# Patient Record
Sex: Female | Born: 1965 | Race: Black or African American | Hispanic: No | Marital: Married | Smoking: Never smoker
Health system: Southern US, Community
[De-identification: ages and names within clinical notes are randomized; demographics above are authoritative.]

## PROBLEM LIST (undated history)

## (undated) DIAGNOSIS — I340 Nonrheumatic mitral (valve) insufficiency: Secondary | ICD-10-CM

## (undated) DIAGNOSIS — M461 Sacroiliitis, not elsewhere classified: Secondary | ICD-10-CM

## (undated) DIAGNOSIS — D509 Iron deficiency anemia, unspecified: Secondary | ICD-10-CM

## (undated) DIAGNOSIS — R002 Palpitations: Secondary | ICD-10-CM

## (undated) DIAGNOSIS — K5090 Crohn's disease, unspecified, without complications: Secondary | ICD-10-CM

## (undated) DIAGNOSIS — I341 Nonrheumatic mitral (valve) prolapse: Secondary | ICD-10-CM

## (undated) DIAGNOSIS — E162 Hypoglycemia, unspecified: Secondary | ICD-10-CM

## (undated) DIAGNOSIS — Z77090 Contact with and (suspected) exposure to asbestos: Secondary | ICD-10-CM

## (undated) DIAGNOSIS — R202 Paresthesia of skin: Secondary | ICD-10-CM

## (undated) DIAGNOSIS — A6000 Herpesviral infection of urogenital system, unspecified: Secondary | ICD-10-CM

## (undated) DIAGNOSIS — I071 Rheumatic tricuspid insufficiency: Secondary | ICD-10-CM

## (undated) DIAGNOSIS — J45909 Unspecified asthma, uncomplicated: Secondary | ICD-10-CM

## (undated) DIAGNOSIS — E559 Vitamin D deficiency, unspecified: Secondary | ICD-10-CM

## (undated) DIAGNOSIS — M47819 Spondylosis without myelopathy or radiculopathy, site unspecified: Secondary | ICD-10-CM

## (undated) HISTORY — DX: Paresthesia of skin: R20.2

## (undated) HISTORY — DX: Palpitations: R00.2

## (undated) HISTORY — DX: Rheumatic tricuspid insufficiency: I07.1

## (undated) HISTORY — DX: Unspecified asthma, uncomplicated: J45.909

## (undated) HISTORY — PX: CHOLECYSTECTOMY: SHX55

## (undated) HISTORY — DX: Nonrheumatic mitral (valve) insufficiency: I34.0

## (undated) HISTORY — PX: ABDOMINAL SURGERY: SHX537

## (undated) HISTORY — DX: Nonrheumatic mitral (valve) prolapse: I34.1

## (undated) HISTORY — DX: Hypoglycemia, unspecified: E16.2

## (undated) HISTORY — DX: Iron deficiency anemia, unspecified: D50.9

## (undated) HISTORY — DX: Herpesviral infection of urogenital system, unspecified: A60.00

## (undated) HISTORY — DX: Spondylosis without myelopathy or radiculopathy, site unspecified: M47.819

## (undated) HISTORY — DX: Contact with and (suspected) exposure to asbestos: Z77.090

## (undated) HISTORY — DX: Vitamin D deficiency, unspecified: E55.9

## (undated) HISTORY — DX: Sacroiliitis, not elsewhere classified: M46.1

---

## 1997-10-08 ENCOUNTER — Inpatient Hospital Stay (HOSPITAL_COMMUNITY): Admission: AD | Admit: 1997-10-08 | Discharge: 1997-10-08 | Payer: Self-pay | Admitting: Obstetrics and Gynecology

## 1998-05-04 ENCOUNTER — Inpatient Hospital Stay (HOSPITAL_COMMUNITY): Admission: AD | Admit: 1998-05-04 | Discharge: 1998-05-04 | Payer: Self-pay | Admitting: Obstetrics & Gynecology

## 1998-05-08 ENCOUNTER — Observation Stay (HOSPITAL_COMMUNITY): Admission: AD | Admit: 1998-05-08 | Discharge: 1998-05-09 | Payer: Self-pay | Admitting: *Deleted

## 1998-05-15 ENCOUNTER — Inpatient Hospital Stay (HOSPITAL_COMMUNITY): Admission: AD | Admit: 1998-05-15 | Discharge: 1998-05-21 | Payer: Self-pay | Admitting: Obstetrics and Gynecology

## 2000-11-28 ENCOUNTER — Encounter: Admission: RE | Admit: 2000-11-28 | Discharge: 2000-11-28 | Payer: Self-pay | Admitting: Gastroenterology

## 2000-11-28 ENCOUNTER — Encounter: Payer: Self-pay | Admitting: Gastroenterology

## 2000-12-18 ENCOUNTER — Encounter (INDEPENDENT_AMBULATORY_CARE_PROVIDER_SITE_OTHER): Payer: Self-pay | Admitting: Specialist

## 2000-12-18 ENCOUNTER — Ambulatory Visit (HOSPITAL_COMMUNITY): Admission: RE | Admit: 2000-12-18 | Discharge: 2000-12-18 | Payer: Self-pay | Admitting: Gastroenterology

## 2001-01-19 ENCOUNTER — Other Ambulatory Visit: Admission: RE | Admit: 2001-01-19 | Discharge: 2001-01-19 | Payer: Self-pay | Admitting: Obstetrics and Gynecology

## 2001-03-06 ENCOUNTER — Encounter (INDEPENDENT_AMBULATORY_CARE_PROVIDER_SITE_OTHER): Payer: Self-pay | Admitting: Specialist

## 2001-03-06 ENCOUNTER — Inpatient Hospital Stay (HOSPITAL_COMMUNITY): Admission: RE | Admit: 2001-03-06 | Discharge: 2001-03-12 | Payer: Self-pay | Admitting: Surgery

## 2001-07-29 ENCOUNTER — Encounter: Admission: RE | Admit: 2001-07-29 | Discharge: 2001-07-29 | Payer: Self-pay | Admitting: Gastroenterology

## 2001-07-29 ENCOUNTER — Encounter: Payer: Self-pay | Admitting: Gastroenterology

## 2001-12-28 ENCOUNTER — Inpatient Hospital Stay (HOSPITAL_COMMUNITY): Admission: AD | Admit: 2001-12-28 | Discharge: 2001-12-28 | Payer: Self-pay | Admitting: Obstetrics and Gynecology

## 2002-01-01 ENCOUNTER — Encounter: Payer: Self-pay | Admitting: Obstetrics and Gynecology

## 2002-01-01 ENCOUNTER — Ambulatory Visit (HOSPITAL_COMMUNITY): Admission: RE | Admit: 2002-01-01 | Discharge: 2002-01-01 | Payer: Self-pay | Admitting: Obstetrics and Gynecology

## 2002-02-01 ENCOUNTER — Other Ambulatory Visit: Admission: RE | Admit: 2002-02-01 | Discharge: 2002-02-01 | Payer: Self-pay | Admitting: Obstetrics and Gynecology

## 2002-02-18 ENCOUNTER — Encounter: Payer: Self-pay | Admitting: Obstetrics and Gynecology

## 2002-02-18 ENCOUNTER — Ambulatory Visit (HOSPITAL_COMMUNITY): Admission: RE | Admit: 2002-02-18 | Discharge: 2002-02-18 | Payer: Self-pay | Admitting: Obstetrics and Gynecology

## 2002-02-25 ENCOUNTER — Encounter (INDEPENDENT_AMBULATORY_CARE_PROVIDER_SITE_OTHER): Payer: Self-pay

## 2002-02-25 ENCOUNTER — Ambulatory Visit (HOSPITAL_COMMUNITY): Admission: RE | Admit: 2002-02-25 | Discharge: 2002-02-25 | Payer: Self-pay | Admitting: Obstetrics and Gynecology

## 2003-05-09 ENCOUNTER — Other Ambulatory Visit: Admission: RE | Admit: 2003-05-09 | Discharge: 2003-05-09 | Payer: Self-pay | Admitting: Obstetrics and Gynecology

## 2003-06-02 ENCOUNTER — Encounter: Admission: RE | Admit: 2003-06-02 | Discharge: 2003-06-02 | Payer: Self-pay | Admitting: Obstetrics and Gynecology

## 2004-08-06 ENCOUNTER — Other Ambulatory Visit: Admission: RE | Admit: 2004-08-06 | Discharge: 2004-08-06 | Payer: Self-pay | Admitting: Obstetrics and Gynecology

## 2004-08-14 ENCOUNTER — Ambulatory Visit (HOSPITAL_COMMUNITY): Admission: RE | Admit: 2004-08-14 | Discharge: 2004-08-14 | Payer: Self-pay | Admitting: Gastroenterology

## 2005-09-12 ENCOUNTER — Encounter: Admission: RE | Admit: 2005-09-12 | Discharge: 2005-09-12 | Payer: Self-pay | Admitting: Surgery

## 2005-10-09 ENCOUNTER — Other Ambulatory Visit: Admission: RE | Admit: 2005-10-09 | Discharge: 2005-10-09 | Payer: Self-pay | Admitting: Obstetrics and Gynecology

## 2005-10-17 ENCOUNTER — Encounter: Admission: RE | Admit: 2005-10-17 | Discharge: 2005-10-17 | Payer: Self-pay | Admitting: Gastroenterology

## 2005-12-13 ENCOUNTER — Inpatient Hospital Stay (HOSPITAL_COMMUNITY): Admission: RE | Admit: 2005-12-13 | Discharge: 2005-12-19 | Payer: Self-pay | Admitting: Surgery

## 2005-12-13 ENCOUNTER — Encounter (INDEPENDENT_AMBULATORY_CARE_PROVIDER_SITE_OTHER): Payer: Self-pay | Admitting: *Deleted

## 2007-05-13 ENCOUNTER — Encounter: Admission: RE | Admit: 2007-05-13 | Discharge: 2007-05-13 | Payer: Self-pay | Admitting: Obstetrics and Gynecology

## 2007-10-23 DIAGNOSIS — I341 Nonrheumatic mitral (valve) prolapse: Secondary | ICD-10-CM

## 2007-10-23 DIAGNOSIS — I340 Nonrheumatic mitral (valve) insufficiency: Secondary | ICD-10-CM

## 2007-10-23 DIAGNOSIS — I071 Rheumatic tricuspid insufficiency: Secondary | ICD-10-CM

## 2007-10-23 HISTORY — DX: Nonrheumatic mitral (valve) insufficiency: I34.0

## 2007-10-23 HISTORY — DX: Nonrheumatic mitral (valve) prolapse: I34.1

## 2007-10-23 HISTORY — DX: Rheumatic tricuspid insufficiency: I07.1

## 2008-05-13 ENCOUNTER — Encounter: Admission: RE | Admit: 2008-05-13 | Discharge: 2008-05-13 | Payer: Self-pay | Admitting: Obstetrics and Gynecology

## 2009-05-16 ENCOUNTER — Encounter: Admission: RE | Admit: 2009-05-16 | Discharge: 2009-05-16 | Payer: Self-pay | Admitting: Obstetrics and Gynecology

## 2009-05-26 ENCOUNTER — Encounter: Admission: RE | Admit: 2009-05-26 | Discharge: 2009-05-26 | Payer: Self-pay | Admitting: Obstetrics and Gynecology

## 2009-11-19 IMAGING — MG MM SCREEN MAMMOGRAM BILATERAL
4 series · 4 of 4 positions shown · non-contrast
Comparison: none

DG SCREEN MAMMOGRAM BILATERAL
Bilateral CC and MLO view(s) were taken.

DIGITAL SCREENING MAMMOGRAM WITH CAD:
The breast tissue is heterogeneously dense.  No masses or malignant type calcifications are 
identified.  Compared with prior studies.

[R CC]
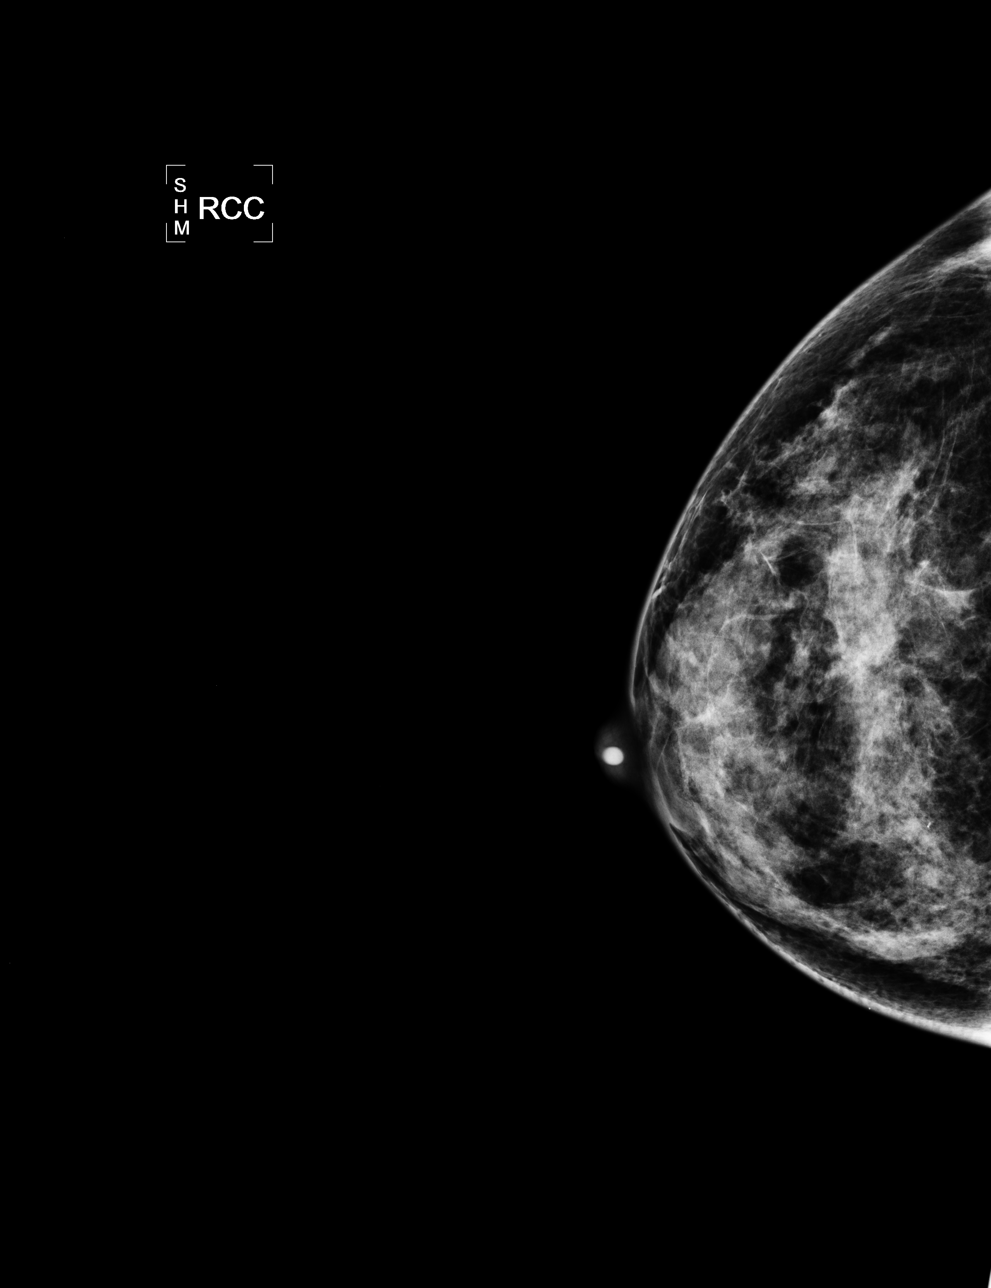

[L CC]
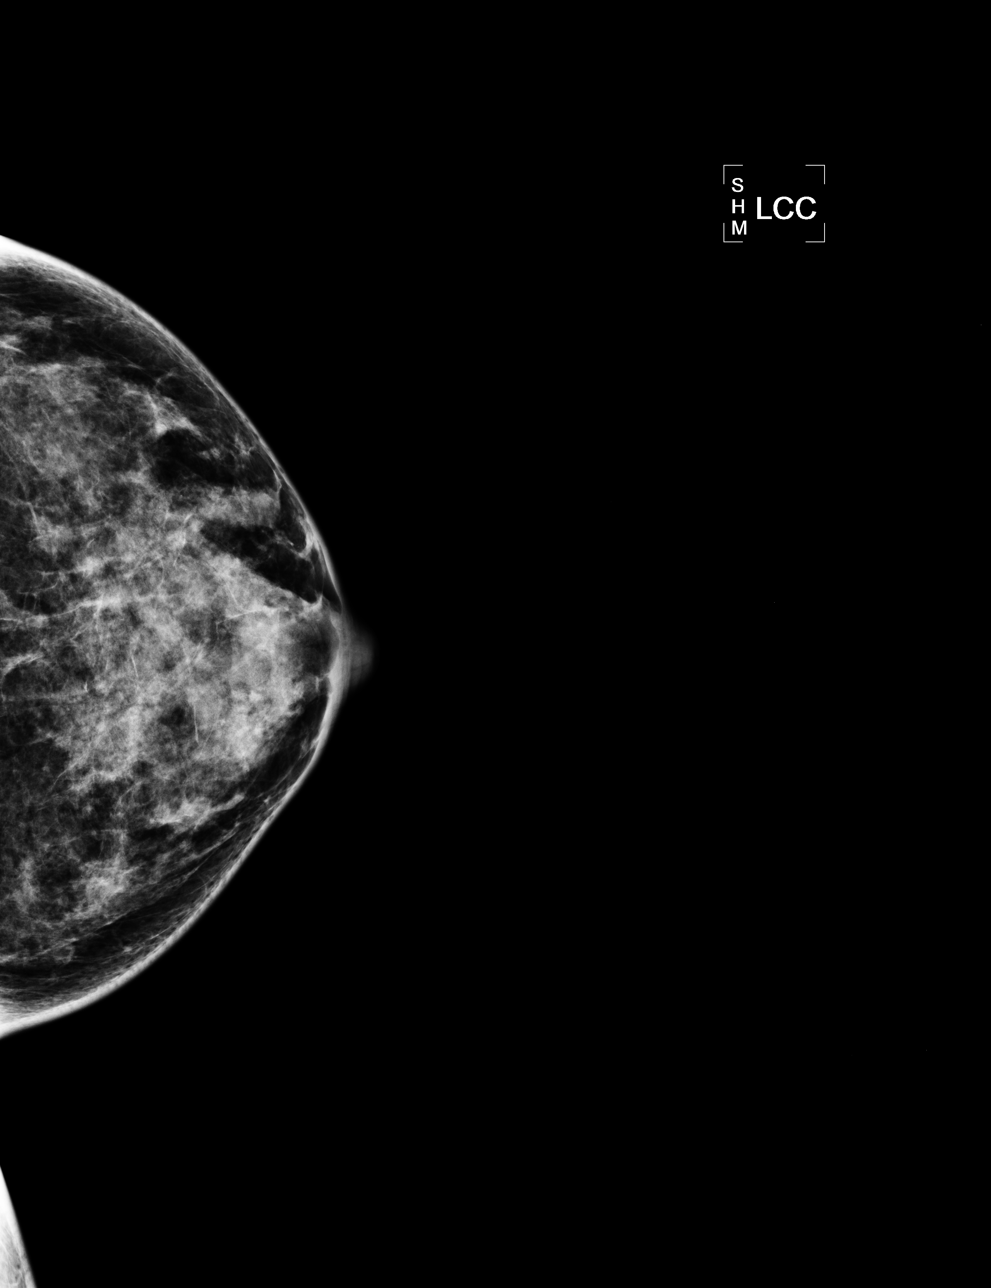

[L MLO]
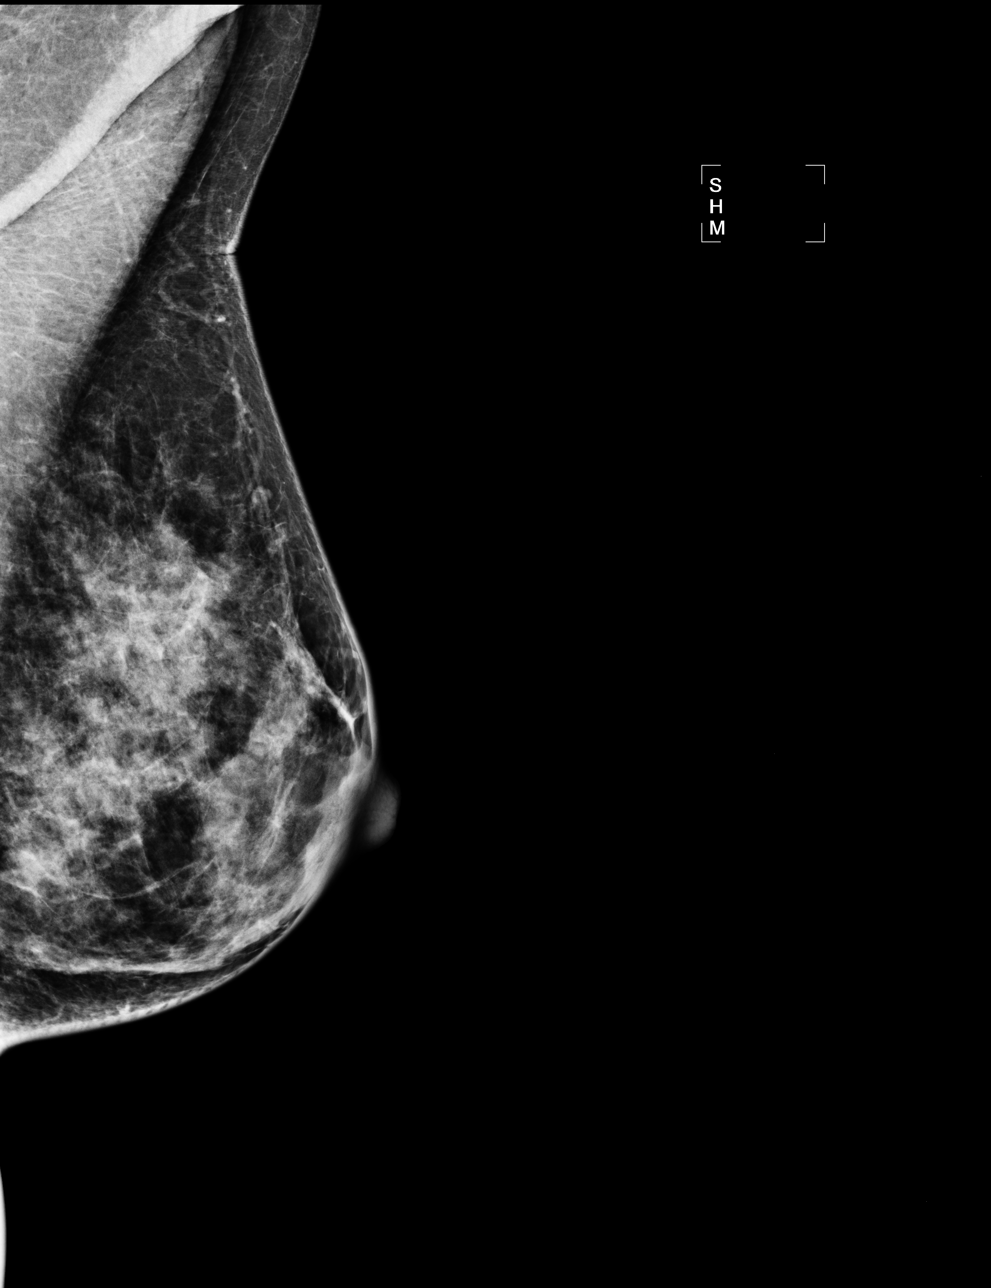

[R MLO]
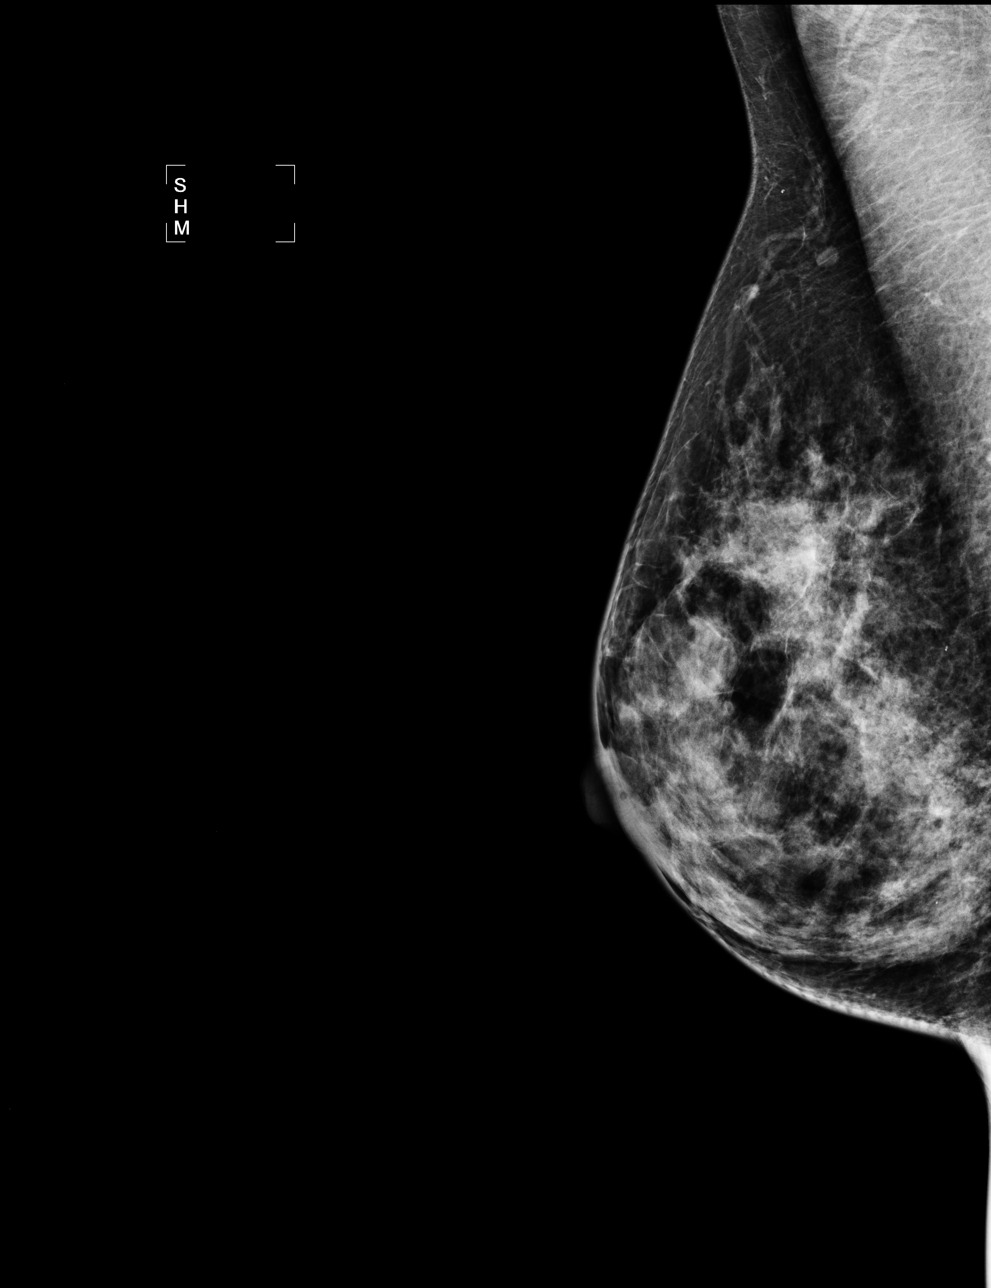

[4 of 4 positions shown; findings below may reference images not displayed]

IMPRESSION: No specific mammographic evidence of malignancy.  Next screening mammogram is recommended in one 
year.

ASSESSMENT: Negative - BI-RADS 1

Screening mammogram in 1 year.
ANALYZED BY COMPUTER AIDED DETECTION. , THIS PROCEDURE WAS A DIGITAL MAMMOGRAM.

## 2010-03-23 ENCOUNTER — Encounter: Admission: RE | Admit: 2010-03-23 | Discharge: 2010-03-23 | Payer: Self-pay | Admitting: Family Medicine

## 2010-05-07 ENCOUNTER — Ambulatory Visit (HOSPITAL_COMMUNITY): Admission: RE | Admit: 2010-05-07 | Discharge: 2010-05-08 | Payer: Self-pay | Admitting: General Surgery

## 2010-05-07 ENCOUNTER — Encounter (INDEPENDENT_AMBULATORY_CARE_PROVIDER_SITE_OTHER): Payer: Self-pay | Admitting: General Surgery

## 2010-05-30 ENCOUNTER — Encounter
Admission: RE | Admit: 2010-05-30 | Discharge: 2010-05-30 | Payer: Self-pay | Source: Home / Self Care | Admitting: Obstetrics and Gynecology

## 2010-07-15 ENCOUNTER — Encounter: Payer: Self-pay | Admitting: Gastroenterology

## 2010-09-04 LAB — CBC
HCT: 34.4 % — ABNORMAL LOW (ref 36.0–46.0)
HCT: 40.5 % (ref 36.0–46.0)
Hemoglobin: 13.4 g/dL (ref 12.0–15.0)
MCHC: 33 g/dL (ref 30.0–36.0)
MCHC: 33.9 g/dL (ref 30.0–36.0)
MCV: 82.6 fL (ref 78.0–100.0)
Platelets: 323 10*3/uL (ref 150–400)
RBC: 4.9 MIL/uL (ref 3.87–5.11)
RDW: 13.8 % (ref 11.5–15.5)
WBC: 13.1 10*3/uL — ABNORMAL HIGH (ref 4.0–10.5)

## 2010-09-04 LAB — DIFFERENTIAL
Eosinophils Absolute: 0.5 10*3/uL (ref 0.0–0.7)
Lymphocytes Relative: 29 % (ref 12–46)

## 2010-09-04 LAB — COMPREHENSIVE METABOLIC PANEL
Alkaline Phosphatase: 71 U/L (ref 39–117)
Alkaline Phosphatase: 88 U/L (ref 39–117)
BUN: 14 mg/dL (ref 6–23)
BUN: 5 mg/dL — ABNORMAL LOW (ref 6–23)
CO2: 29 mEq/L (ref 19–32)
Calcium: 9 mg/dL (ref 8.4–10.5)
Chloride: 101 mEq/L (ref 96–112)
Chloride: 106 mEq/L (ref 96–112)
Creatinine, Ser: 0.7 mg/dL (ref 0.4–1.2)
GFR calc Af Amer: >60 mL/min (ref >60)
GFR calc non Af Amer: >60 mL/min (ref >60)
Glucose, Bld: 120 mg/dL — ABNORMAL HIGH (ref 70–99)
Glucose, Bld: 85 mg/dL (ref 70–99)
Potassium: 4.2 mEq/L (ref 3.5–5.1)
Total Protein: 7 g/dL (ref 6.0–8.3)

## 2010-11-09 NOTE — Discharge Summary (Signed)
[facility]  Patient:    Melanie Hansen, Melanie Hansen Visit Number: [id number] MRN: [id number]          Service Type: SUR Location: 4W 0470 01 Attending Physician:  Shelly Rubenstein Admit Date:  03/06/2001 Discharge Date: 03/12/2001   CC:         Verlin Grills, M.D.   Discharge Summary  SUMMARY OF HISTORY:  Melanie Hansen is a pleasant 45 year old female with a history of peptic ulcer disease who now presents with stricturing of her duodenum confirmed by upper GI series as well as endoscopy by Dr. Danise Edge.  She is now admitted for elective vagotomy and antrectomy.  HOSPITAL COURSE:  The patient was admission to the hospital on March 06, 2001, and taken to the operating room where she underwent a vagotomy and antrectomy.  She tolerated the procedure well and was taken in stable condition to a regular surgical floor with an NG tube in place.  Over the next several days, she was maintained on NPO status and NG drainage which she tolerated very well.  Her hemoglobin was 7.9 postoperatively, but she has had chronic anemia with hemoglobins as low as 5.  By postoperative day #4, she continued to do well and her NG tube was removed.  By postoperative day #6, she was continuing to do well and her hemoglobin was 6.8 and stable at this time.  She remained asymptomatic from this and she was advanced to a regular diet.  By September 19, she was doing very well.  She was tolerating a regular diet, was ambulating well and her incision was healing well.  Her hemoglobin, at this time, was 7.1.  The decision was made to discharge the patient to home.  DISCHARGE DIAGNOSIS:  Gastric outlet obstruction status post vagotomy and antrectomy.  DISCHARGE DIET:  Regular.  DISCHARGE ACTIVITY:  As tolerated.  DISCHARGE INSTRUCTIONS:  She will take Vicodin for pain.  She will continue iron.  She will resume her other home medications.  She may shower.  She  will follow up in my office in one week post discharge. Attending Physician:  Shelly Rubenstein DD:  04/06/01 TD:  04/06/01 Job: [id number]

## 2010-11-09 NOTE — H&P (Signed)
NAME:  Melanie Hansen, Melanie Hansen                         ACCOUNT NO.:  [id number]   MEDICAL RECORD NO.:  [id number]                   PATIENT TYPE:  AMB   LOCATION:  SDC                                  FACILITY:  [facility]   PHYSICIAN:  Naima A. Dillard, M.D.              DATE OF BIRTH:  September 15, 1965   DATE OF ADMISSION:  02/25/2002  DATE OF DISCHARGE:                                HISTORY & PHYSICAL   CHIEF COMPLAINT:  Missed abortion in the first trimester.   HISTORY OF PRESENT ILLNESS:  The patient is a 45 year old African-American  female G2, P1-0-0-1 whose last menstrual period was November 24, 2001.  The  patient was seen for spotting in maternity admissions January 01, 2002 and  ultrasound demonstrated a positive yolk sac with a crown rump length of 6  weeks 1 day, no fetal heart tones.  Repeat ultrasound on February 10, 2002 was  done for viability.  There were no fetal heart tones and no fetal movement  or fetal pole with an irregular gestational sac measuring 7 weeks 6 days.  Another repeat ultrasound was done February 18, 2002 which demonstrated a  gestational sac measuring 8 weeks 3 days, but no fetal pole.  Normal right  ovary and normal left ovary with no adnexal masses or free fluid.  The  patient denies having any vaginal bleeding, cramping, or abdominal pain.   PAST MEDICAL HISTORY:  Unremarkable.   PAST OBSTETRIC HISTORY:  Significant for a cesarean section in November 1999  secondary to failure to progress.  There were no complications.   PAST SURGICAL HISTORY:  Significant for a cesarean section x1 and stomach  surgery for questionable blockage.  She says she had a vagotomy.   MEDICATIONS:  None.   FAMILY HISTORY:  Significant for diabetes and hypertension in both parents.  No GYN cancer.   SOCIAL HISTORY:  Negative x3.   REVIEW OF SYMPTOMS:  ENDOCRINE:  Unremarkable.  CARDIOVASCULAR:  She has a  history of some heart palpitations.  She was taking a medication that she  cannot  recall and all this occurred after she had a vagotomy so she is  currently followed by a cardiologist.  GASTROINTESTINAL:  As above.  NEUROLOGIC:  Unremarkable.  MUSCULOSKELETAL:  Unremarkable.  GENITOURINARY:  As above.   PHYSICAL EXAMINATION:  VITAL SIGNS:  Blood pressure 90/60, weight 99 pounds.  GENERAL:  She is a well-developed, well-nourished female in no apparent  distress.  HEENT:  Head is normocephalic, atraumatic.  NECK:  Free range of motion.  HEART:  Regular rate and rhythm.  LUNGS:  Clear to auscultation bilaterally.  ABDOMEN:  Nondistended, soft, and nontender.  PELVIC:  Vulvovaginal examination is within normal limits.  Her cervix has  no lesions, closed.  Her uterus is about 8 weeks size, nontender with no  adnexal masses.  EXTREMITIES:  No clubbing, cyanosis, edema.   LABORATORIES:  She  is O-.   ASSESSMENT:  Missed abortion in the first trimester.  The patient was given  the options of observation versus a dilatation and evacuation versus trying  to miscarry on her own.  The patient has chosen dilatation and evacuation.  She understands that the risks are, but not limited to, bleeding, infection,  damage to internal organs by perforation, and Asherman's syndrome.  The  patient still agreed with the plan.  She was already given RhoGAM and found  to have positive anti D antibodies on her blood that was drawn in August  2003.                                               Naima A. Normand Sloop, M.D.    NAD/MEDQ  D:  02/25/2002  T:  02/25/2002  Job:  [id number]

## 2010-11-09 NOTE — Op Note (Signed)
   NAME:  Melanie Hansen, Melanie Hansen                         ACCOUNT NO.:  [id number]   MEDICAL RECORD NO.:  [id number]                   PATIENT TYPE:  AMB   LOCATION:  SDC                                  FACILITY:  [facility]   PHYSICIAN:  Naima A. Dillard, M.D.              DATE OF BIRTH:  [birth year]   DATE OF PROCEDURE:  DATE OF DISCHARGE:                                 OPERATIVE REPORT   PREOPERATIVE DIAGNOSES:  Missed abortion in the first trimester.   POSTOPERATIVE DIAGNOSES:  Missed abortion in the first trimester.   PROCEDURE:  Dilatation and evacuation.   SURGEON:  Naima A. Normand Sloop, M.D.   ANESTHESIA:  MAC.   ESTIMATED BLOOD LOSS:  Minimal.   IV FLUIDS:  700 cc crystalloid.   Scant products of conception were obtained.   FINDINGS:  An 8 week sized uterus, nontender with no adnexal masses.  There  was plenty of fluid obtained, only it looks like scant POC.  Beta hCG quant  before procedure was 57.   PROCEDURE IN DETAIL:  The patient was taken to the operating room where she  was given MAC anesthesia, placed in dorsal lithotomy position, and prepped  and draped in the normal sterile fashion.  A bivalve speculum was placed  into the vagina.  The anterior lip of the cervix was grasped with a single  tooth tenaculum.  Cervix was then dilated with Pratt dilators up to 21.  Suction curettage was then used to obtain products of conception and curette  the uterus.  There was a lot of fluid obtained; however, just scant products  of conception obtained.  Sharp curette was then placed into the uterus and  sharp curettage was done until a gritty texture was noted.  Still, scant  POCs were obtained.  The suction curettage was then replaced into the uterus  and only blood foam just came from the uterus.  The patient did deny passing  any tissue; however, a quant is only 57.  All instruments were removed from  the vagina.  Tenaculum was removed from the cervix with good hemostasis  noted.   Dictation ended at this point.                                               Naima A. Normand Sloop, M.D.    NAD/MEDQ  D:  02/25/2002  T:  02/25/2002  Job:  [id number]

## 2010-11-09 NOTE — Op Note (Signed)
[facility]  Patient:    SHANNIE, KONTOS Visit Number: [id number] MRN: [id number]          Service Type: END Location: ENDO Attending Physician:  Dennison Bulla Ii Proc. Date: 03/06/01 Admit Date:  12/18/2000 Discharge Date: 12/18/2000                             Operative Report  PREOPERATIVE DIAGNOSIS:   Gastric outlet obstruction.  POSTOPERATIVE DIAGNOSIS:  Gastric outlet obstruction.  OPERATION:  Vagotomy and antrectomy.  SURGEON:  Abigail Miyamoto, M.D.  ASSISTANT: Angelia Mould. Derrell Lolling, M.D.  INDICATIONS:  Malissa Slay is a 45 year old female with peptic ulcer disease who presents with stricturing of her duodenum.  Upper GI series demonstrated the stricturing in the second portion of the duodenum.  This was confirmed on endoscopy by Dr. Reece Agar.  He was unable to pass his endoscope past the area in the duodenum.  Despite conservative measures, the patient has not been able to resolve the gastric outlet obstruction.  She has only been able to take liquids.  Secondary to this, decision was made to proceed to the operating room for vagotomy and antrectomy.  FINDINGS:  Endoscopic biopsies of the area confirmed the ulceration without evidence of carcinoma.  DESCRIPTION OF PROCEDURE: The patient was brought to the operating room and identified as Ranelle Oyster.  She is placed on the operating table and general anesthesia was induced.  Her abdomen was then prepped and draped in the usual sterile fashion.  An upper midline incision was then created with the scalpel.  This was carried down through the fascia with electrocautery. The peritoneum was opened entirely.  Upon entering the abdomen, the stomach and duodenum were examined.  The patient was found to have a large area of stricturing in the second portion of the duodenum from the pylorus past the area of the common bile duct.  Given these findings, decision was made to proceed  with an antrectomy and vagotomy.  The esophagus was then examined. The anterior vagus was identified and clipped proximally and distally and then transected, and the specimen was sent to pathology for identification. The window just medial to the esophagus was opened with the electrocautery and Metzenbaum scissors.  A Penrose drain was then passed around the esophagus, and the esophagus was retracted.  The posterior vagus was then identified and clipped proximally and distally and a section was transected and sent to pathology for identification as well.  Next, the lesser mandible was opened up with Kelly clamps with silk ties.  The omentum was also opened up creating a window at the incisor of the stomach.  The proximal stomach was then transected with TA90 stapler.  The dissection was then distally carried distally, freeing up the stomach from its blood supply to the pylorus and duodenum.  The duodenum was then transected again with a TA90 stapler just distal to the pylorus but proximal to the strictured disease.  The stomach was then oversewn with 2-0 silk sutures.  Next a window was created in the colonic mesentery.  The ligament of Treitz was identified and small bowel was then identified distal to this and brought up in a retrocolic fashion.  The small bowel was then reapproximated to the posterior wall of the stomach by first putting the bowel and approximately with silk sutures.  An anastomosis was then created after enterotomies were made with the TIA-75 stapler.  The opening  was then closed with two separate firings of the TIA-60 stapler.     A large anastomosis appeared to be created.  The suture line appeared to be hemostatic.  At this point, the abdomen was copiously irrigated with normal saline.  The anastomosis was brought down through the window in the colonic mesentery and sewn in place circumferentially with interrupted 3-0 silk sutures to the mesentery.  The NG was  examined and found to be in good place. From this point, all sponges were retracted and removed from the abdomen.  The midline fascia was then closed with a running #1 PDS suture.  The skin was then irrigated and closed with skin staples.  The patient tolerated the procedure well.  All sponge, needle, and instrument counts were correct at the end of the procedure.  The patient was then taken to the operating room and taken in stable condition to the recovery room. Attending Physician:  Dennison Bulla Ii DD:  03/06/01 TD:  03/06/01 Job: [id number]

## 2010-11-09 NOTE — Op Note (Signed)
NAMEMICHELL, GIULIANO NO.:  [id number]   MEDICAL RECORD NO.:  [id number]          PATIENT TYPE:  AMB   LOCATION:  ENDO                         FACILITY:  [facility]   PHYSICIAN:  Danise Edge, M.D.   DATE OF BIRTH:  03-10-1966   DATE OF PROCEDURE:  08/14/2004  DATE OF DISCHARGE:                                 OPERATIVE REPORT   PROCEDURE:  Esophagogastroduodenoscopy and colonoscopy.   INDICATIONS:  Melanie Hansen is a 45 year old female, born March 30, 1966.  Melanie Hansen is undergoing diagnostic colonoscopy to evaluate  intermittent painless hematochezia.  She has undergone a Billroth II  gastrectomy to manage peptic ulcer disease complicated by duodenal  obstruction.   ENDOSCOPIST:  Reece Agar, MD   PREMEDICATION:  1.  Versed 7 mg.  2.  Demerol 50 mg.   PROCEDURE:  Diagnostic esophagogastroduodenoscopy.  After obtaining informed  consent, Melanie Hansen was placed in the left lateral decubitus position.  I  administered intravenous Demerol and intravenous Versed to achieve conscious  sedation for the procedure.  The patient's blood pressure, oxygen  saturation, and cardiac rhythm were monitored throughout the procedure and  documented in the medical record.   The Olympus gastroscope was passed through the posterior hypopharynx and  into the proximal esophagus without difficulty.  The hypopharynx and larynx  appeared normal.  I did not visualize the vocal cords.   Esophagoscopy:  The proximal, mid, and lower segments of the esophageal  mucosa appeared normal.   Gastroscopy:  Retroflexed view of the gastric cardia and fundus was normal.  The remaining gastric pouch appears normal.  The Billroth II anastomosis is  widely patent.  The efferent and afferent limbs of the gastrojejunostomy  appeared normal.   Assessment:  Normal esophagogastroenteroscopy post Billroth II gastrectomy.   PROCEDURE:  Colonoscopy to the proximal ascending colon.  Anal  inspection  and digital rectal exam were normal.  The Olympus adjustable pediatric  colonoscope was introduced into the rectum and advanced to the proximal  ascending colon.  I was able to peer into the cecum but not intubate the  cecum due to colonic loop formation.  Colonic preparation for the exam today  is quite poor with a large amount of residual liquid and soft stool  throughout the colon.   Rectum normal.  Sigmoid colon and descending colon normal.  Splenic flexure normal.  Transverse colon normal.  Hepatic flexure normal.  Ascending colon normal  Cecum and ileocecal valve.  The cecum was not intubated due to colonic loop  formation. What I could visualize of the cecum appeared normal.   ASSESSMENT:  Normal proctocolonoscopy to the proximal ascending colon.  Despite having a poor colonic prep with water lavage of the rectum and  colon, I got a reasonable look and do not find any signs of inflammatory  bowel disease and no large polyps or active bleeding from the large  intestine.      MJ/MEDQ  D:  08/14/2004  T:  08/14/2004  Job:  [id number]

## 2010-11-09 NOTE — Procedures (Signed)
[facility]  Patient:    Melanie Hansen, Melanie Hansen                        MRN: [id number] Proc. Date: 12/18/00 Attending:  Verlin Grills, M.D. CC:         Arvella Merles, M.D.   Procedure Report  PROCEDURE:  Esophagogastroduodenoscopy.  INDICATIONS:  Ms. Melanie Hansen (date of birth 07-13-2065) is a 45 year old female.  In August 2001, she developed epigastric discomfort, heartburn, and regurgitation not associated with dysphasia or odynophagia.  She also had menorrhagia complicated by iron deficiency anemia.  [name] was placed on a proton pump inhibitor (Nexium) which has eliminated her heartburn and regurgitation.  She is now experiencing predominantly postprandial epigastric discomfort associated with excessive belching and flatus.  Her bowel movements remain normal.  [name] maximum weight has been 125 pounds, but she reports here usual weight as being approximately 115 pounds.  She currently weighs 100 pounds. As a result of her intestinal symptoms and family stress, including the death of her grandmother, she has lost weight.  Her menorrhagia seems to have lessened since starting her low estrogen birth control pills.  She remains on Nexium 40 mg each morning to prevent heartburn and regurgitation.  MEDICATION ALLERGIES:  None.  CHRONIC MEDICATIONS:  Nexium, Zyrtec, Lotensin, multivitamin with iron.  PAST MEDICAL HISTORY:  No chronic medical problems or surgery.  HABITS:  [name] does not smoke cigarettes or consume alcohol.  FAMILY HISTORY:  Her 64 year old father has hypertension.  Her 23 year old mother has diabetes and peptic ulcer disease.  Her 16 year old sister has multiple sclerosis.  SOCIAL HISTORY:  [name] is married.  Her 27 year old husband is in good health.  Her 30-year-old son is in good health.  [name] underwent an abdominal ultrasound and upper GI x-ray series at the [facility] a couple of weeks ago.  Her  abdominal ultrasound was normal.  Her upper GI x-ray series revealed pyloric channel narrowing.  ENDOSCOPIST:  Verlin Grills, M.D.  PREMEDICATIONS:  Demerol 50 mg, Versed 5 mg.  ENDOSCOPE:  Olympus gastroscope.  DESCRIPTION OF PROCEDURE:  After obtaining informed consent, Ms. Melanie Hansen was placed in the left lateral decubitus position.  I administered intravenous Demerol and intravenous Versed to achieve conscious sedation for the procedure.  The patients blood pressure, oxygen saturation, and cardiac rhythm were monitored throughout the procedure and documented in the medical record.  The Olympus gastroscope was passed through the posterior hypopharynx into the proximal esophagus without difficulty.  The hypopharynx, larynx and vocal cords appeared normal.  Esophagoscopy:  The proximal, mid, and lower segments of the esophagus appeared completely normal.  The squamocolumnar junction and the esophagogastric junction are noted at approximately 40 cm from the incisor teeth.  Endoscopically, there is no evidence of for the presence of Barretts esophagus, esophageal mucosal scarring, erosive esophagitis, or esophageal ulceration.  Gastroscopy:  Retroflex view of the gastric cardia and fundus was normal.  The gastric body and antrum appeared normal.  There was narrowing of the pyloric channel.  Duodenoscopy:  There is marked scarring and ulceration of the pyloric channel, proximal duodenal bulb.  There is marked narrowing in the duodenum to the point I was unable to intubate the second portion of the duodenum.  CLOtest was performed from pyloric channel biopsy.  Biopsies were also taken from the scarred, ulcerated duodenal bulb.  ASSESSMENT:  Probable chronic peptic ulcer disease and acute duodenal ulceration  complicated by duodenal obstruction.  RECOMMENDATIONS:  Await biopsies.  The patient is already on Nexium.  I think she will require surgery to relieve her duodenal  obstruction. DD:  12/18/00 TD:  12/18/00 Job: [id number]

## 2010-11-09 NOTE — Discharge Summary (Signed)
Melanie Hansen, Melanie Hansen NO.:  [id number]   MEDICAL RECORD NO.:  [id number]          PATIENT TYPE:  INP   LOCATION:  5737                         FACILITY:  [facility]   PHYSICIAN:  Wilmon Arms. Corliss Skains, M.D. 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   PROCEDURE:  Exploratory laparotomy with sigmoid colon resection and small  bowel resection.   HISTORY:  The patient is a pleasant 45 year old female who was recently  diagnosed with Crohn disease.  Workup included a CT scan which showed  thickening of the terminal ileum with an enterocolic fistula of the distal  ileum to the sigmoid colon.  Her symptoms have improved with medical  treatment and she presented for resection of the infected segments of  bowels.   HOSPITAL COURSE:  The patient was admitted to the hospital on December 13, 2005,  after undergoing a bowel prep at home.  She was brought to the operating  room where she underwent exploratory laparotomy with an ileocecostomy and  sigmoid colectomy .  The patient had 2 anastomosis created in stapled  fashion.  Postoperatively, the patient has some fever on postop day #1 and  2.  Workup showed an elevated white count.  Chest x-ray was then obtained  which showed a right lower lobe infiltrate.  She has since been started on  Avelox.  She had then depraves and her symptoms improved.  Her ileus  resolved postoperative day #4 and she was started on clear liquids.  Her  nasogastric tube was removed at that time.  Patient then progressed to have  a bowel movement and was advanced to a regular diet on the day of discharge.  She is tolerating a regular diet without difficulty.  Her wound seems to be  healing well with no sign of infection.  Her pain is well-controlled with  p.r.n. Vicodin.   DISCHARGE  INSTRUCTIONS:  The patient is given Vicodin p.r.n. for pain.  She  is given Avelox to complete her one week course; she has had 4 days in the  hospital and would get 3 days more days at home.  She is to follow up on  Monday with Dr. Corliss Skains, for staple removal.  She may resume a regular diet.  She should limit her activity to avoid any strenuous activity.      Wilmon Arms. Tsuei, M.D.  Electronically Signed     MKT/MEDQ  D:  12/19/2005  T:  12/19/2005  Job:  [id number]   cc:   Danise Edge, M.D.  Fax: [phone]

## 2010-11-09 NOTE — Op Note (Signed)
Melanie Hansen, BRAUD NO.:  [id number]   MEDICAL RECORD NO.:  [id number]          PATIENT TYPE:  INP   LOCATION:  2853                         FACILITY:  [facility]   PHYSICIAN:  Wilmon Arms. Corliss Skains, M.D. DATE OF BIRTH:  1965-10-06   DATE OF PROCEDURE:  12/13/2005  DATE OF DISCHARGE:                                 OPERATIVE REPORT   PREOPERATIVE DIAGNOSIS:  Crohn's disease with enterocolic fistula.   POSTOPERATIVE DIAGNOSIS:  Crohn's disease with enterocolic fistula.   PROCEDURE PERFORMED:  Exploratory laparotomy with ileocecectomy and sigmoid  colectomy.   SURGEON:  Wilmon Arms. Corliss Skains, M.D.   ASSISTANT:  Currie Paris, M.D.   ANESTHESIA:  General endotracheal.   INDICATIONS:  The patient is a 45 year old female who was recently referred  for evaluation of pain in her right groin.  She also had a small bulge in  this area which was felt to possibly be a hernia.  However, on initial  evaluation the patient is noted to have a large amount of diarrhea.  I  obtained a CT scan which showed thickening of the terminal ileum with an  enterocolic fistula in the distal to the sigmoid colon.  She has been  treated by Dr. Danise Edge with Entercort and Pentasa and her symptoms  had improved significantly.  She presents today for elective resection of  the fistulized area.   DESCRIPTION OF PROCEDURE:  The patient is brought to the operating room and  placed in the supine position on the operating table.  After an adequate  level of general anesthesia was obtained, a Foley catheter was placed under  sterile technique.  The patient's legs were then placed in yellow fin  stirrups in low lithotomy position.  Her abdomen was then prepped with  Betadine and draped in a sterile fashion.  A lower midline incision was made  from the symphysis pubis up to the umbilicus.  Dissection was carried down  to the fascia which was opened along its midline.  There were no adhesions  in  the lower abdomen.  We explored the pelvis and an inflammatory mass was  noted in the left lower quadrant in the pelvis.  The patient was noted to  have a very loose redundant sigmoid colon.  This inflammatory mass appeared  to involve the terminal ileum and the sigmoid colon.  No other inflammation  was noted.  Overall, the small bowel appeared fairly normal in appearance.  The Balfour retractor with bladder blade was then inserted.  The patient was  placed into Trendelenburg position and her small bowel was packed away.  Careful dissection was then used to take down the fistula.  The fistulized  area of the sigmoid colon appeared to involve a hairpin turn of the sigmoid  colon.  The fistula opening was too large to allow primary closure.  Therefore, the sigmoid colon was divided proximally and distally to the  inflamed area with GIA staplers.  We then performed an ileocecectomy by  dividing the terminal ileum just proximal to the inflammatory mass and the  cecum.  This was  accomplished, also, with a GIA stapler.  We then created  our anastomoses, first with the stapled end of the ascending colon to the  ileum.  This was created with the GIA stapler.  Enterotomies were created on  both limbs and the jaws of the GIA stapler was inserted.  The stapler was  fired and the anastomosis was inspected for hemostasis.  The enterotomy was  closed with a TA-60 stapler.  A reinforcing suture of 2-0 Vicryl was placed  at the crotch.  The mesenteric defect was closed with interrupted figure-of-  eight 2-0 silk sutures.  The anastomosis was palpated and was felt to be  widely patent.  We then created our anastomosis to the redundant sigmoid  colon to the lower sigmoid.  The patient had enough sigmoid colon to allow  this to lay side to side.  A similar stapled anastomosis was created by  opening the staple lines and inserting the GIA stapler.  The common defect  was closed with a running 3-0 Vicryl suture  and a reinforcing 2-0 silk  interrupted layer.  No mesenteric defect needed closure.  The abdomen was  then thoroughly irrigated with saline.  We carefully inspected the patient's  right inguinal canal for any possible hernia.  No hernia defect was noted.  The palpable mass previously may have been from a small ventral hernia  defect at her Pfannenstiel incision.  The wound was once again thoroughly  irrigated.  The patient did not have enough omentum to reach down into the  pelvis.  The fascia was then closed with a running #1 PDS suture.  The knots  were buried underneath the fascia.  The subcutaneous tissues were irrigated  and the skin was closed with staples.  The patient was then extubated and  brought to the recovery room in stable condition.  All sponge, instrument,  and needle counts correct.      Wilmon Arms. Tsuei, M.D.  Electronically Signed     MKT/MEDQ  D:  12/13/2005  T:  12/13/2005  Job:  [id number]

## 2011-04-25 ENCOUNTER — Other Ambulatory Visit: Payer: Self-pay | Admitting: Obstetrics and Gynecology

## 2011-04-25 DIAGNOSIS — Z1231 Encounter for screening mammogram for malignant neoplasm of breast: Secondary | ICD-10-CM

## 2011-06-03 ENCOUNTER — Ambulatory Visit: Payer: Self-pay

## 2011-07-03 ENCOUNTER — Ambulatory Visit
Admission: RE | Admit: 2011-07-03 | Discharge: 2011-07-03 | Disposition: A | Discharge status: Home / Self Care | Payer: Self-pay | Source: Ambulatory Visit | Attending: Obstetrics and Gynecology | Admitting: Obstetrics and Gynecology

## 2011-07-03 DIAGNOSIS — Z1231 Encounter for screening mammogram for malignant neoplasm of breast: Secondary | ICD-10-CM

## 2011-08-09 ENCOUNTER — Ambulatory Visit
Admission: RE | Admit: 2011-08-09 | Discharge: 2011-08-09 | Disposition: A | Discharge status: Home / Self Care | Payer: 59 | Source: Ambulatory Visit | Attending: Family Medicine | Admitting: Family Medicine

## 2011-08-09 ENCOUNTER — Other Ambulatory Visit: Payer: Self-pay | Admitting: Family Medicine

## 2011-08-09 DIAGNOSIS — M545 Low back pain: Secondary | ICD-10-CM

## 2012-06-01 ENCOUNTER — Other Ambulatory Visit: Payer: Self-pay | Admitting: Family Medicine

## 2012-06-01 DIAGNOSIS — Z1231 Encounter for screening mammogram for malignant neoplasm of breast: Secondary | ICD-10-CM

## 2012-07-09 ENCOUNTER — Inpatient Hospital Stay: Admission: RE | Admit: 2012-07-09 | Payer: 59 | Source: Ambulatory Visit

## 2012-09-28 ENCOUNTER — Other Ambulatory Visit: Payer: Self-pay

## 2012-09-28 DIAGNOSIS — Z1231 Encounter for screening mammogram for malignant neoplasm of breast: Secondary | ICD-10-CM

## 2012-10-28 ENCOUNTER — Ambulatory Visit
Admission: RE | Admit: 2012-10-28 | Discharge: 2012-10-28 | Disposition: A | Discharge status: Home / Self Care | Payer: 59 | Source: Ambulatory Visit

## 2012-10-28 DIAGNOSIS — Z1231 Encounter for screening mammogram for malignant neoplasm of breast: Secondary | ICD-10-CM

## 2012-12-14 ENCOUNTER — Encounter (HOSPITAL_BASED_OUTPATIENT_CLINIC_OR_DEPARTMENT_OTHER): Payer: Self-pay | Admitting: *Deleted

## 2012-12-14 ENCOUNTER — Emergency Department (HOSPITAL_BASED_OUTPATIENT_CLINIC_OR_DEPARTMENT_OTHER)
Admission: EM | Admit: 2012-12-14 | Discharge: 2012-12-14 | Disposition: A | Discharge status: Home / Self Care | Payer: 59 | Attending: Emergency Medicine | Admitting: Emergency Medicine

## 2012-12-14 DIAGNOSIS — H15101 Unspecified episcleritis, right eye: Secondary | ICD-10-CM

## 2012-12-14 DIAGNOSIS — H15009 Unspecified scleritis, unspecified eye: Secondary | ICD-10-CM | POA: Insufficient documentation

## 2012-12-14 DIAGNOSIS — H5710 Ocular pain, unspecified eye: Secondary | ICD-10-CM | POA: Insufficient documentation

## 2012-12-14 HISTORY — DX: Crohn's disease, unspecified, without complications: K50.90

## 2012-12-14 MED ORDER — DICLOFENAC SODIUM 0.1 % OP SOLN: 1.0000 [drp] | Freq: Four times a day (QID) | OPHTHALMIC | Status: AC

## 2012-12-14 MED ORDER — FLUOROMETHOLONE 0.1 % OP OINT: TOPICAL_OINTMENT | Freq: Four times a day (QID) | OPHTHALMIC | Status: AC

## 2012-12-14 MED ORDER — FLUORESCEIN SODIUM 1 MG OP STRP
ORAL_STRIP | OPHTHALMIC | Status: AC
  Administered 2012-12-14: 08:00:00
  Filled 2012-12-14: qty 1

## 2012-12-14 MED ORDER — TETRACAINE HCL 0.5 % OP SOLN
OPHTHALMIC | Status: AC
  Administered 2012-12-14: 08:00:00
  Filled 2012-12-14: qty 2

## 2012-12-14 NOTE — ED Notes (Signed)
Right eye swollen onset last pm when she felt like it was getting irritated and woke up with eye swollen shut

## 2012-12-14 NOTE — ED Provider Notes (Signed)
History     CSN: [id number]  Arrival date & time 12/14/12  4401   None     Chief Complaint  Patient presents with  . eye swollen     (Consider location/radiation/quality/duration/timing/severity/associated sxs/prior treatment) HPI  47 y.o. Female with right eye pain and swelling.  Patient felt like she had something in it last night when she was washing dishes.  Patient went to sleep soon after.  Awoke this am with righ eye swollen closed and some crusting.  States history of corneal abrasion.  She wears contacts at times but has not worn for months.  Vision seems okay to her now.    No past medical history on file. Crohns' sacroileitis No past surgical history on file.  No family history on file.  History  Substance Use Topics  . Smoking status: Not on file  . Smokeless tobacco: Not on file  . Alcohol Use: Not on file    OB History   No data available      Review of Systems  Allergies  Review of patient's allergies indicates not on file.  Home Medications  No current outpatient prescriptions on file. meloxicam There were no vitals taken for this visit.  Physical Exam  Nursing note and vitals reviewed. Constitutional: She is oriented to person, place, and time. She appears well-developed and well-nourished.  HENT:  Head: Normocephalic and atraumatic.  Right Ear: External ear normal.  Left Ear: External ear normal.  Nose: Nose normal.  Mouth/Throat: Oropharynx is clear and moist.  Eyes: EOM are normal. Pupils are equal, round, and reactive to light. No foreign bodies found. Right eye exhibits chemosis and discharge. No foreign body present in the right eye. Right conjunctiva is injected.  Slit lamp exam:      The right eye shows no corneal abrasion, no corneal flare, no corneal ulcer, no foreign body, no hyphema, no hypopyon, no fluorescein uptake and no anterior chamber bulge.  Right eye with diffuse conjunctival injection with lateral bright red injection  with swelling c.w. Episcleritis.   Neck: Normal range of motion. Neck supple.  No preauricular adenopathy  Cardiovascular: Normal rate.   Pulmonary/Chest: Effort normal.  Musculoskeletal: Normal range of motion.  Lymphadenopathy:    She has no cervical adenopathy.  Neurological: She is alert and oriented to person, place, and time.  Skin: Skin is warm and dry.  Psychiatric: She has a normal mood and affect.    ED Course  Procedures (including critical care time)  Labs Reviewed - No data to display No results found.   No diagnosis found.    MDM  Patient with normal visual acuity. She denies any deep pain but has some fb sensation.  No fb seen and given patient's history of crohn's, this presentation is c.w. Episcleritis.  Patient without known contacts for conjunctivitis.  She is seen by Dr. Nile Riggs and advised close follow up.  Plan topical lubricants, ketorolac, and fluorometholone.  Patient given return precautions.        Hilario Quarry, MD 12/14/12 [phone]

## 2012-12-14 NOTE — ED Notes (Signed)
Chart reviewed and care assumed.  Dr. Rosalia Hammers at bedside.

## 2013-10-07 ENCOUNTER — Other Ambulatory Visit: Payer: Self-pay

## 2013-10-07 DIAGNOSIS — Z1231 Encounter for screening mammogram for malignant neoplasm of breast: Secondary | ICD-10-CM

## 2013-11-01 ENCOUNTER — Encounter (INDEPENDENT_AMBULATORY_CARE_PROVIDER_SITE_OTHER): Payer: Self-pay

## 2013-11-01 ENCOUNTER — Ambulatory Visit
Admission: RE | Admit: 2013-11-01 | Discharge: 2013-11-01 | Disposition: A | Discharge status: Home / Self Care | Payer: 59 | Source: Ambulatory Visit

## 2013-11-01 DIAGNOSIS — Z1231 Encounter for screening mammogram for malignant neoplasm of breast: Secondary | ICD-10-CM

## 2014-11-17 ENCOUNTER — Other Ambulatory Visit: Payer: Self-pay

## 2014-11-17 DIAGNOSIS — Z1231 Encounter for screening mammogram for malignant neoplasm of breast: Secondary | ICD-10-CM

## 2014-12-02 ENCOUNTER — Ambulatory Visit
Admission: RE | Admit: 2014-12-02 | Discharge: 2014-12-02 | Disposition: A | Discharge status: Home / Self Care | Payer: 59 | Source: Ambulatory Visit

## 2014-12-02 DIAGNOSIS — Z1231 Encounter for screening mammogram for malignant neoplasm of breast: Secondary | ICD-10-CM

## 2015-11-10 ENCOUNTER — Other Ambulatory Visit: Payer: Self-pay

## 2015-11-10 DIAGNOSIS — Z803 Family history of malignant neoplasm of breast: Secondary | ICD-10-CM

## 2015-11-10 DIAGNOSIS — Z1231 Encounter for screening mammogram for malignant neoplasm of breast: Secondary | ICD-10-CM

## 2015-12-08 ENCOUNTER — Ambulatory Visit: Payer: 59

## 2016-07-02 ENCOUNTER — Encounter: Payer: 59 | Attending: Family Medicine | Admitting: *Deleted

## 2016-07-02 DIAGNOSIS — E119 Type 2 diabetes mellitus without complications: Secondary | ICD-10-CM | POA: Diagnosis not present

## 2016-07-02 DIAGNOSIS — Z713 Dietary counseling and surveillance: Secondary | ICD-10-CM | POA: Diagnosis not present

## 2016-07-02 NOTE — Progress Notes (Signed)

## 2016-07-04 ENCOUNTER — Inpatient Hospital Stay: Admission: RE | Admit: 2016-07-04 | Payer: 59 | Source: Ambulatory Visit

## 2016-07-09 DIAGNOSIS — E119 Type 2 diabetes mellitus without complications: Secondary | ICD-10-CM

## 2016-07-09 DIAGNOSIS — Z713 Dietary counseling and surveillance: Secondary | ICD-10-CM | POA: Diagnosis not present

## 2016-07-15 ENCOUNTER — Ambulatory Visit: Payer: 59

## 2016-07-16 ENCOUNTER — Ambulatory Visit: Payer: 59

## 2016-07-16 ENCOUNTER — Encounter: Payer: 59 | Admitting: *Deleted

## 2016-07-16 DIAGNOSIS — M4690 Unspecified inflammatory spondylopathy, site unspecified: Secondary | ICD-10-CM | POA: Diagnosis not present

## 2016-07-16 DIAGNOSIS — Z713 Dietary counseling and surveillance: Secondary | ICD-10-CM | POA: Diagnosis not present

## 2016-07-16 DIAGNOSIS — K50818 Crohn's disease of both small and large intestine with other complication: Secondary | ICD-10-CM | POA: Diagnosis not present

## 2016-07-16 DIAGNOSIS — E119 Type 2 diabetes mellitus without complications: Secondary | ICD-10-CM

## 2016-07-16 DIAGNOSIS — M461 Sacroiliitis, not elsewhere classified: Secondary | ICD-10-CM | POA: Diagnosis not present

## 2016-07-16 NOTE — Progress Notes (Signed)
Patient was seen on 07/17/2015 for the third of a series of three diabetes self-management courses at the [facility].   Melanie Hansen. State the amount of activity recommended for healthy living . Describe activities suitable for individual needs . Identify ways to regularly incorporate activity into daily life . Identify barriers to activity and ways to over come these barriers  Identify diabetes medications being personally used and their primary action for lowering glucose and possible side effects . Describe role of stress on blood glucose and develop strategies to address psychosocial issues . Identify diabetes complications and ways to prevent them  Explain how to manage diabetes during illness . Evaluate success in meeting personal goal . Establish 2-3 goals that they will plan to diligently work on until they return for the  3352-month follow-up visit  Goals:   I will count my carb choices at most meals and snacks  I will test my glucose at least 1 times a day, 3 days a week  Your patient has identified these potential barriers to change:  Motivation   Your patient has identified their diabetes self-care support plan as  On-line Resources Plan:  Attend Support Group as desired

## 2016-07-16 NOTE — Progress Notes (Signed)

## 2016-08-07 ENCOUNTER — Ambulatory Visit
Admission: RE | Admit: 2016-08-07 | Discharge: 2016-08-07 | Disposition: A | Discharge status: Home / Self Care | Payer: 59 | Source: Ambulatory Visit

## 2016-08-07 DIAGNOSIS — Z1231 Encounter for screening mammogram for malignant neoplasm of breast: Secondary | ICD-10-CM

## 2016-08-07 DIAGNOSIS — Z803 Family history of malignant neoplasm of breast: Secondary | ICD-10-CM

## 2016-09-30 DIAGNOSIS — Z23 Encounter for immunization: Secondary | ICD-10-CM | POA: Diagnosis not present

## 2016-09-30 DIAGNOSIS — E119 Type 2 diabetes mellitus without complications: Secondary | ICD-10-CM | POA: Diagnosis not present

## 2017-02-17 DIAGNOSIS — R202 Paresthesia of skin: Secondary | ICD-10-CM | POA: Diagnosis not present

## 2017-02-17 DIAGNOSIS — R21 Rash and other nonspecific skin eruption: Secondary | ICD-10-CM | POA: Diagnosis not present

## 2017-02-17 DIAGNOSIS — Z23 Encounter for immunization: Secondary | ICD-10-CM | POA: Diagnosis not present

## 2017-02-17 DIAGNOSIS — R002 Palpitations: Secondary | ICD-10-CM | POA: Diagnosis not present

## 2017-02-18 ENCOUNTER — Telehealth: Payer: Self-pay

## 2017-02-18 NOTE — Telephone Encounter (Signed)
SENT NOTES TO SCHEDULING 

## 2017-02-21 ENCOUNTER — Encounter: Payer: Self-pay | Admitting: *Deleted

## 2017-03-13 DIAGNOSIS — H10502 Unspecified blepharoconjunctivitis, left eye: Secondary | ICD-10-CM | POA: Diagnosis not present

## 2017-03-17 ENCOUNTER — Encounter (INDEPENDENT_AMBULATORY_CARE_PROVIDER_SITE_OTHER): Payer: Self-pay

## 2017-03-17 ENCOUNTER — Ambulatory Visit (INDEPENDENT_AMBULATORY_CARE_PROVIDER_SITE_OTHER): Payer: 59 | Admitting: Internal Medicine

## 2017-03-17 VITALS — BP 112/80 | HR 82 | Ht 61.0 in | Wt 112.0 lb

## 2017-03-17 DIAGNOSIS — R002 Palpitations: Secondary | ICD-10-CM | POA: Diagnosis not present

## 2017-03-17 NOTE — Patient Instructions (Signed)
Your physician recommends that you continue on your current medications as directed. Please refer to the Current Medication list given to you today. Your physician has recommended that you wear an event monitor. Event monitors are medical devices that record the heart's electrical activity. Doctors most often us these monitors to diagnose arrhythmias. Arrhythmias are problems with the speed or rhythm of the heartbeat. The monitor is a small, portable device. You can wear one while you do your normal daily activities. This is usually used to diagnose what is causing palpitations/syncope (passing out).  Follow up with your physician will depend on test results.   

## 2017-03-17 NOTE — Progress Notes (Signed)
Cardiology Office Note   Date:  03/17/2017   ID:  Melanie MAGGARD, DOB 1965/10/21, MRN [id number]  PCP:  Laurann Montana, MD  Cardiologist:   Dietrich Pates, MD   Pt referred by Melanie Hansen for palpitations      History of Present Illness: Melanie Hansen is a 51 y.o. female with a history of 51 yo with intermitt L arm and hand numbness  Seen by C White  She also has a histroy of  palpitatons     On an off for 3 months  More persistent   Palpitations last a few min   HR in  100s  Fatigued aferwoards  Some tightness with this   Mother had afib   Episodes occur a few times per day  On and aoff for many years     Caffeine irritate  Kast min    ALways tired  ? Due to crohns dz      Current Meds  Medication Sig  . albuterol (PROVENTIL HFA;VENTOLIN HFA) 108 (90 Base) MCG/ACT inhaler Inhale 2 puffs into the lungs as needed for wheezing or shortness of breath.  . calcium carbonate (TUMS - DOSED IN MG ELEMENTAL CALCIUM) 500 MG chewable tablet Chew 2 tablets by mouth daily.  . Cholecalciferol (VITAMIN D3 PO) Take by mouth daily.  . clotrimazole-betamethasone (LOTRISONE) cream Apply 1 application topically 2 (two) times daily.  . Cyanocobalamin (VITAMIN B 12 PO) Take by mouth.  . diclofenac (VOLTAREN) 0.1 % ophthalmic solution Place 1 drop into the right eye 4 (four) times daily. (Patient taking differently: Place 1 drop into the right eye 4 (four) times daily as needed. )  . fluticasone (FLONASE) 50 MCG/ACT nasal spray Place 1 spray into both nostrils daily.  Marland Kitchen glucose blood (ACCU-CHEK GUIDE) test strip 1 each by Other route 2 (two) times daily. Use as instructed  . lansoprazole (PREVACID) 30 MG capsule Take 30 mg by mouth daily as needed.   . meloxicam (MOBIC) 15 MG tablet Take 15 mg by mouth daily.  . Multiple Vitamin (MULTIVITAMIN) tablet Take 1 tablet by mouth daily.  Marland Kitchen omega-3 acid ethyl esters (LOVAZA) 1 g capsule Take 1 g by mouth daily.  . valACYclovir (VALTREX) 500 MG tablet Take 500 mg  by mouth 2 (two) times daily as needed.  Marland Kitchen VITAMIN E PO Take by mouth.     Allergies:   Patient has no known allergies.   Past Medical History:  Diagnosis Date  . Asbestos exposure   . Asthma    as a child  . Crohn disease (HCC)   . Genital herpes   . Hypoglycemia   . Iron deficiency anemia   . Mitral valve prolapse 10/2007  . Mitral valve regurgitation 10/2007  . Palpitations   . Paresthesia of left arm   . Sacroiliitis (HCC)   . Spondyloarthropathy (HCC)   . Tricuspid regurgitation 10/2007  . Vitamin D deficiency     Past Surgical History:  Procedure Laterality Date  . ABDOMINAL SURGERY    . CHOLECYSTECTOMY       Social History:  The patient  reports that she has never smoked. She has never used smokeless tobacco. She reports that she does not drink alcohol or use drugs.   Family History:  The patient's family history includes Breast cancer in her paternal aunt and sister.  FHx :  Dad qwith heart dz   Mom with afib    ROS:  Please see the history of present  illness. All other systems are reviewed and  Negative to the above problem except as noted.    PHYSICAL EXAM: VS:  BP 112/80   Pulse 82   Ht  (1.549 m)   Wt 112 lb (50.8 kg)   BMI 21.16 kg/m   GEN: Well nourished, well developed, in no acute distress  HEENT: normal  Neck: no JVD, carotid bruits, or masses Cardiac: RRR; no murmurs, rubs, or gallops,no edema  Respiratory:  clear to auscultation bilaterally, normal work of breathing GI: soft, nontender, nondistended, + BS  No hepatomegaly  MS: no deformity Moving all extremities   Skin: warm and dry, no rash Neuro:  Strength and sensation are intact Psych: euthymic mood, full affect   EKG:  EKG is ordered today.  SR 82  LVH        Lipid Panel No results found for: CHOL, TRIG, HDL, CHOLHDL, VLDL, LDLCALC, LDLDIRECT    Wt Readings from Last 3 Encounters:  03/17/17 112 lb (50.8 kg)  07/02/16 113 lb 6.4 oz (51.4 kg)      ASSESSMENT AND  PLAN: 1  Palpitatons   Long standing  Interimtt  Symptomatic  I am not convinced atrial fib   I would recomm an event montior to document Avoid stimulants   Stay hydrated  Stay active    2  Arm numbness  Sounds more muscular  Current medicines are reviewed at length with the patient today.  The patient does not have concerns regarding medicines.  Signed, Dietrich Pates, MD  03/17/2017 2:26 PM    [facility] [address] [place], [place], [place]  [address] Phone: [phone]; Fax: [phone]

## 2017-03-19 ENCOUNTER — Ambulatory Visit (INDEPENDENT_AMBULATORY_CARE_PROVIDER_SITE_OTHER): Payer: 59

## 2017-03-19 DIAGNOSIS — R002 Palpitations: Secondary | ICD-10-CM | POA: Diagnosis not present

## 2017-03-21 DIAGNOSIS — R002 Palpitations: Secondary | ICD-10-CM | POA: Diagnosis not present

## 2017-05-12 ENCOUNTER — Telehealth: Payer: Self-pay | Admitting: Internal Medicine

## 2017-05-12 MED ORDER — METOPROLOL SUCCINATE ER 25 MG PO TB24: 25.0000 mg | ORAL_TABLET | Freq: Every day | ORAL | 11 refills | Status: AC

## 2017-05-12 NOTE — Telephone Encounter (Signed)
Kennyth ArnoldStacy is returning your call . Please call back on her cell . Thanks

## 2017-05-12 NOTE — Telephone Encounter (Signed)
-----   Message from Pricilla RifflePaula Ross V, MD sent at 04/30/2017 11:02 PM EST ----- No arrhythmias noted   Could try low dose toprol XL 25   Stay hydrated.

## 2017-05-12 NOTE — Telephone Encounter (Signed)
Reviewed with patient. She will pick up medication. May try first to be well hydrated to see if makes a difference, then start if still senses. Advised to call back with any concerns/questions.

## 2017-05-29 DIAGNOSIS — H5213 Myopia, bilateral: Secondary | ICD-10-CM | POA: Diagnosis not present

## 2017-10-23 ENCOUNTER — Other Ambulatory Visit: Payer: Self-pay | Admitting: Obstetrics and Gynecology

## 2017-10-23 DIAGNOSIS — Z1231 Encounter for screening mammogram for malignant neoplasm of breast: Secondary | ICD-10-CM

## 2017-10-27 DIAGNOSIS — M4690 Unspecified inflammatory spondylopathy, site unspecified: Secondary | ICD-10-CM | POA: Diagnosis not present

## 2017-10-27 DIAGNOSIS — M461 Sacroiliitis, not elsewhere classified: Secondary | ICD-10-CM | POA: Diagnosis not present

## 2017-10-27 DIAGNOSIS — K50818 Crohn's disease of both small and large intestine with other complication: Secondary | ICD-10-CM | POA: Diagnosis not present

## 2017-11-14 ENCOUNTER — Ambulatory Visit
Admission: RE | Admit: 2017-11-14 | Discharge: 2017-11-14 | Disposition: A | Discharge status: Home / Self Care | Payer: 59 | Source: Ambulatory Visit | Attending: Obstetrics and Gynecology | Admitting: Obstetrics and Gynecology

## 2017-11-14 DIAGNOSIS — Z1231 Encounter for screening mammogram for malignant neoplasm of breast: Secondary | ICD-10-CM

## 2018-03-25 DIAGNOSIS — J069 Acute upper respiratory infection, unspecified: Secondary | ICD-10-CM | POA: Diagnosis not present

## 2018-03-25 DIAGNOSIS — E119 Type 2 diabetes mellitus without complications: Secondary | ICD-10-CM | POA: Diagnosis not present

## 2018-04-27 DIAGNOSIS — M4690 Unspecified inflammatory spondylopathy, site unspecified: Secondary | ICD-10-CM | POA: Diagnosis not present

## 2018-04-27 DIAGNOSIS — Z79899 Other long term (current) drug therapy: Secondary | ICD-10-CM | POA: Diagnosis not present

## 2018-04-27 DIAGNOSIS — M461 Sacroiliitis, not elsewhere classified: Secondary | ICD-10-CM | POA: Diagnosis not present

## 2018-04-27 DIAGNOSIS — K50818 Crohn's disease of both small and large intestine with other complication: Secondary | ICD-10-CM | POA: Diagnosis not present

## 2018-06-01 DIAGNOSIS — E119 Type 2 diabetes mellitus without complications: Secondary | ICD-10-CM | POA: Diagnosis not present

## 2018-10-21 DIAGNOSIS — E119 Type 2 diabetes mellitus without complications: Secondary | ICD-10-CM | POA: Diagnosis not present

## 2018-10-27 DIAGNOSIS — K50818 Crohn's disease of both small and large intestine with other complication: Secondary | ICD-10-CM | POA: Diagnosis not present

## 2019-01-15 ENCOUNTER — Other Ambulatory Visit: Payer: Self-pay | Admitting: Obstetrics and Gynecology

## 2019-01-15 ENCOUNTER — Other Ambulatory Visit: Payer: Self-pay | Admitting: Surgical

## 2019-01-15 ENCOUNTER — Other Ambulatory Visit: Payer: Self-pay | Admitting: Family Medicine

## 2019-01-15 DIAGNOSIS — Z1231 Encounter for screening mammogram for malignant neoplasm of breast: Secondary | ICD-10-CM

## 2019-03-03 ENCOUNTER — Ambulatory Visit
Admission: RE | Admit: 2019-03-03 | Discharge: 2019-03-03 | Disposition: A | Discharge status: Home / Self Care | Payer: 59 | Source: Ambulatory Visit | Attending: Family Medicine | Admitting: Family Medicine

## 2019-03-03 ENCOUNTER — Other Ambulatory Visit: Payer: Self-pay

## 2019-03-03 DIAGNOSIS — Z1231 Encounter for screening mammogram for malignant neoplasm of breast: Secondary | ICD-10-CM

## 2019-08-18 ENCOUNTER — Other Ambulatory Visit: Payer: Self-pay | Admitting: Obstetrics and Gynecology

## 2019-08-18 ENCOUNTER — Other Ambulatory Visit: Payer: Self-pay

## 2019-08-18 DIAGNOSIS — N950 Postmenopausal bleeding: Secondary | ICD-10-CM

## 2019-09-13 ENCOUNTER — Ambulatory Visit
Admission: RE | Admit: 2019-09-13 | Discharge: 2019-09-13 | Disposition: A | Discharge status: Home / Self Care | Payer: 59 | Source: Ambulatory Visit | Attending: Obstetrics and Gynecology | Admitting: Obstetrics and Gynecology

## 2019-09-13 DIAGNOSIS — N950 Postmenopausal bleeding: Secondary | ICD-10-CM

## 2020-02-15 ENCOUNTER — Other Ambulatory Visit: Payer: Self-pay | Admitting: Family Medicine

## 2020-02-15 DIAGNOSIS — Z1231 Encounter for screening mammogram for malignant neoplasm of breast: Secondary | ICD-10-CM

## 2020-03-03 ENCOUNTER — Ambulatory Visit: Payer: 59

## 2020-07-18 ENCOUNTER — Ambulatory Visit
Admission: RE | Admit: 2020-07-18 | Discharge: 2020-07-18 | Disposition: A | Discharge status: Home / Self Care | Payer: 59 | Source: Ambulatory Visit | Attending: Family Medicine | Admitting: Family Medicine

## 2020-07-18 ENCOUNTER — Other Ambulatory Visit: Payer: Self-pay

## 2020-07-18 DIAGNOSIS — Z1231 Encounter for screening mammogram for malignant neoplasm of breast: Secondary | ICD-10-CM

## 2021-06-11 ENCOUNTER — Other Ambulatory Visit: Payer: Self-pay | Admitting: Family Medicine

## 2021-06-11 DIAGNOSIS — Z1231 Encounter for screening mammogram for malignant neoplasm of breast: Secondary | ICD-10-CM

## 2021-07-18 ENCOUNTER — Ambulatory Visit: Payer: 59

## 2021-12-05 ENCOUNTER — Ambulatory Visit: Payer: 59

## 2021-12-13 ENCOUNTER — Ambulatory Visit
Admission: RE | Admit: 2021-12-13 | Discharge: 2021-12-13 | Disposition: A | Discharge status: Home / Self Care | Payer: 59 | Source: Ambulatory Visit | Attending: Family Medicine | Admitting: Family Medicine

## 2021-12-13 DIAGNOSIS — Z1231 Encounter for screening mammogram for malignant neoplasm of breast: Secondary | ICD-10-CM

## 2022-02-21 ENCOUNTER — Other Ambulatory Visit: Payer: Self-pay | Admitting: Gastroenterology

## 2022-02-21 DIAGNOSIS — Z8719 Personal history of other diseases of the digestive system: Secondary | ICD-10-CM

## 2022-02-22 ENCOUNTER — Other Ambulatory Visit: Payer: Self-pay | Admitting: Internal Medicine

## 2022-03-19 ENCOUNTER — Ambulatory Visit
Admission: RE | Admit: 2022-03-19 | Discharge: 2022-03-19 | Disposition: A | Discharge status: Home / Self Care | Payer: 59 | Source: Ambulatory Visit | Attending: Gastroenterology | Admitting: Gastroenterology

## 2022-03-19 DIAGNOSIS — Z8719 Personal history of other diseases of the digestive system: Secondary | ICD-10-CM

## 2022-03-19 MED ORDER — IOPAMIDOL (ISOVUE-300) INJECTION 61%
125.0000 mL | Freq: Once | INTRAVENOUS | Status: AC | PRN
  Administered 2022-03-19: 125 mL via INTRAVENOUS

## 2022-03-28 ENCOUNTER — Other Ambulatory Visit: Payer: Self-pay | Admitting: Internal Medicine

## 2022-03-28 DIAGNOSIS — Z13820 Encounter for screening for osteoporosis: Secondary | ICD-10-CM

## 2022-04-30 ENCOUNTER — Other Ambulatory Visit: Payer: 59

## 2022-05-01 ENCOUNTER — Ambulatory Visit
Admission: RE | Admit: 2022-05-01 | Discharge: 2022-05-01 | Disposition: A | Discharge status: Home / Self Care | Payer: 59 | Source: Ambulatory Visit | Attending: Internal Medicine | Admitting: Internal Medicine

## 2022-05-01 DIAGNOSIS — Z13820 Encounter for screening for osteoporosis: Secondary | ICD-10-CM

## 2024-03-23 ENCOUNTER — Other Ambulatory Visit (HOSPITAL_BASED_OUTPATIENT_CLINIC_OR_DEPARTMENT_OTHER): Payer: Self-pay | Admitting: Family Medicine

## 2024-03-23 DIAGNOSIS — M8580 Other specified disorders of bone density and structure, unspecified site: Secondary | ICD-10-CM

## 2024-03-30 ENCOUNTER — Other Ambulatory Visit (HOSPITAL_COMMUNITY): Payer: Self-pay | Admitting: Family Medicine

## 2024-03-30 DIAGNOSIS — R748 Abnormal levels of other serum enzymes: Secondary | ICD-10-CM

## 2024-04-06 ENCOUNTER — Encounter (HOSPITAL_COMMUNITY): Payer: Self-pay

## 2024-04-06 ENCOUNTER — Other Ambulatory Visit (HOSPITAL_COMMUNITY)

## 2024-04-21 ENCOUNTER — Encounter (HOSPITAL_COMMUNITY)

## 2024-05-05 ENCOUNTER — Telehealth (HOSPITAL_BASED_OUTPATIENT_CLINIC_OR_DEPARTMENT_OTHER): Payer: Self-pay

## 2024-05-06 ENCOUNTER — Other Ambulatory Visit (HOSPITAL_BASED_OUTPATIENT_CLINIC_OR_DEPARTMENT_OTHER)

## 2024-06-07 ENCOUNTER — Ambulatory Visit (HOSPITAL_COMMUNITY)
Admission: RE | Admit: 2024-06-07 | Discharge: 2024-06-07 | Disposition: A | Source: Ambulatory Visit | Attending: Family Medicine | Admitting: Family Medicine

## 2024-06-07 ENCOUNTER — Encounter (HOSPITAL_COMMUNITY)
Admission: RE | Admit: 2024-06-07 | Discharge: 2024-06-07 | Disposition: A | Discharge status: Home / Self Care | Source: Ambulatory Visit | Attending: Family Medicine | Admitting: Family Medicine

## 2024-06-07 DIAGNOSIS — R748 Abnormal levels of other serum enzymes: Secondary | ICD-10-CM

## 2024-06-07 MED ORDER — TECHNETIUM TC 99M MEDRONATE IV KIT
20.0000 | PACK | Freq: Once | INTRAVENOUS | Status: AC | PRN
  Administered 2024-06-07: 09:00:00 21.5 via INTRAVENOUS

## 2024-07-01 ENCOUNTER — Ambulatory Visit (HOSPITAL_BASED_OUTPATIENT_CLINIC_OR_DEPARTMENT_OTHER)
Admission: RE | Admit: 2024-07-01 | Discharge: 2024-07-01 | Disposition: A | Discharge status: Home / Self Care | Payer: Self-pay | Source: Ambulatory Visit | Attending: Family Medicine | Admitting: Family Medicine

## 2024-07-01 DIAGNOSIS — M8580 Other specified disorders of bone density and structure, unspecified site: Secondary | ICD-10-CM | POA: Insufficient documentation
